# Patient Record
Sex: Male | Born: 1965 | Race: White | Hispanic: No | Marital: Married
Health system: Southern US, Community
[De-identification: ages and names within clinical notes are randomized; demographics above are authoritative.]

---

## 2004-12-06 ENCOUNTER — Encounter: Admission: RE | Admit: 2004-12-06 | Discharge: 2004-12-06 | Payer: Self-pay | Admitting: Internal Medicine

## 2004-12-13 ENCOUNTER — Encounter: Admission: RE | Admit: 2004-12-13 | Discharge: 2004-12-13 | Payer: Self-pay | Admitting: Internal Medicine

## 2004-12-19 ENCOUNTER — Ambulatory Visit (HOSPITAL_COMMUNITY): Admission: RE | Admit: 2004-12-19 | Discharge: 2004-12-20 | Payer: Self-pay | Admitting: Neurosurgery

## 2005-11-08 IMAGING — CR DG CERVICAL SPINE COMPLETE 4+V
6 series · 6 of 6 positions shown · non-contrast
Comparison: None

CLINICAL DATA: Neck pain. History of old cervical injury. No acute injury.

CERVICAL SPINE - 5 VIEW

[view not recorded (1 of 6)]
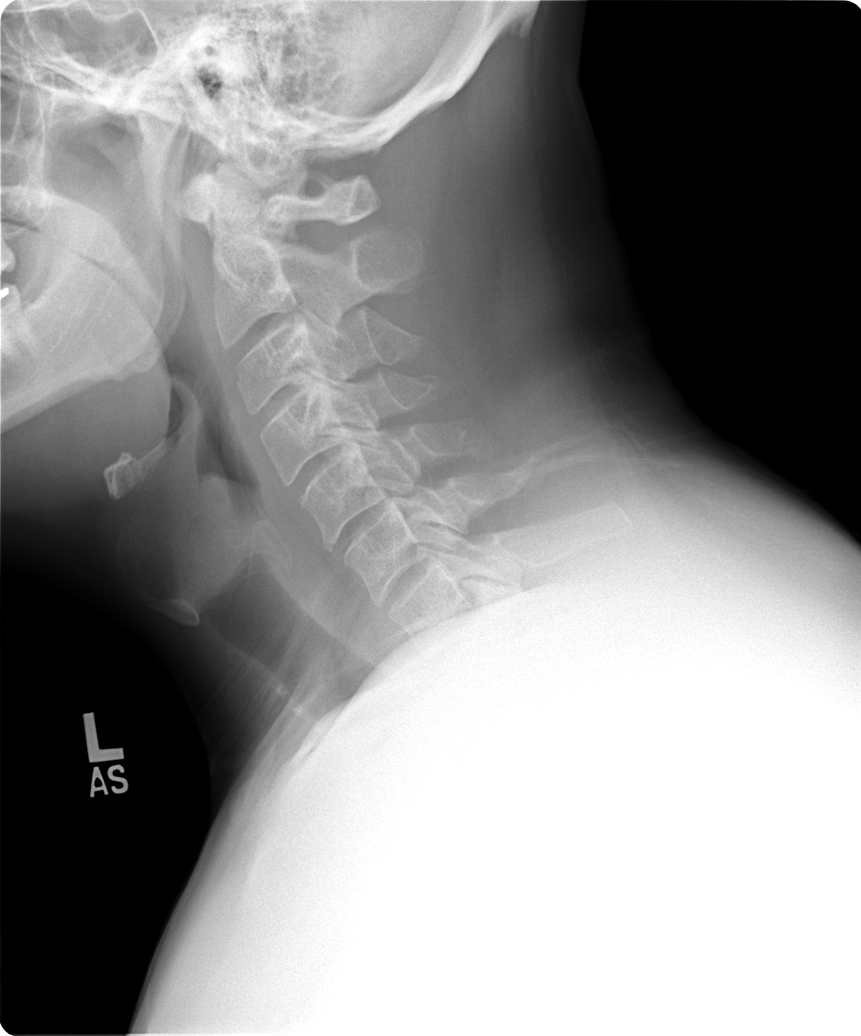

[view not recorded (2 of 6)]
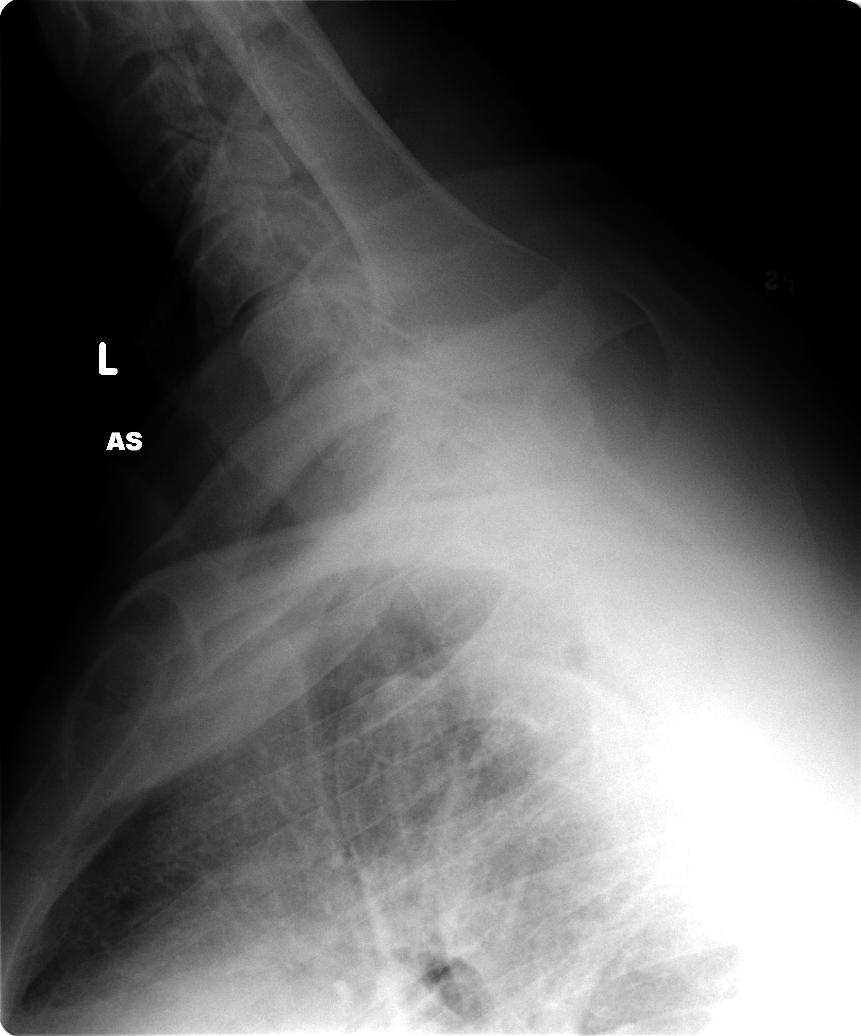

[view not recorded (3 of 6)]
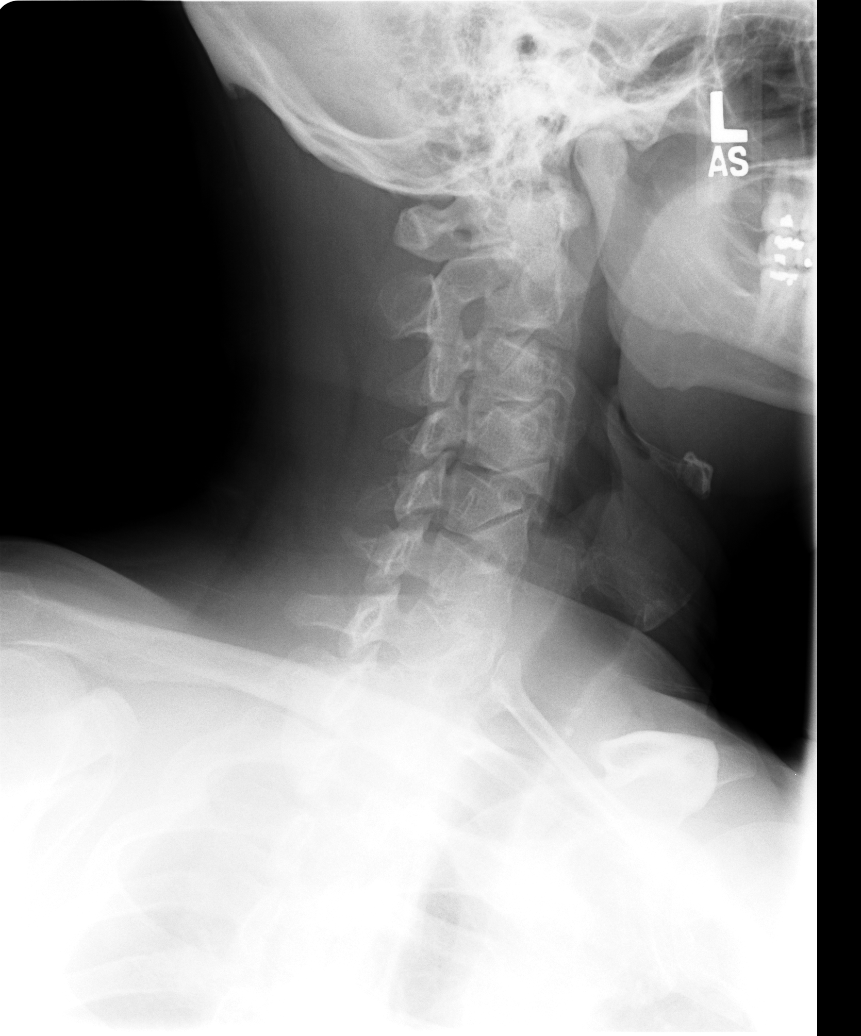

[view not recorded (4 of 6)]
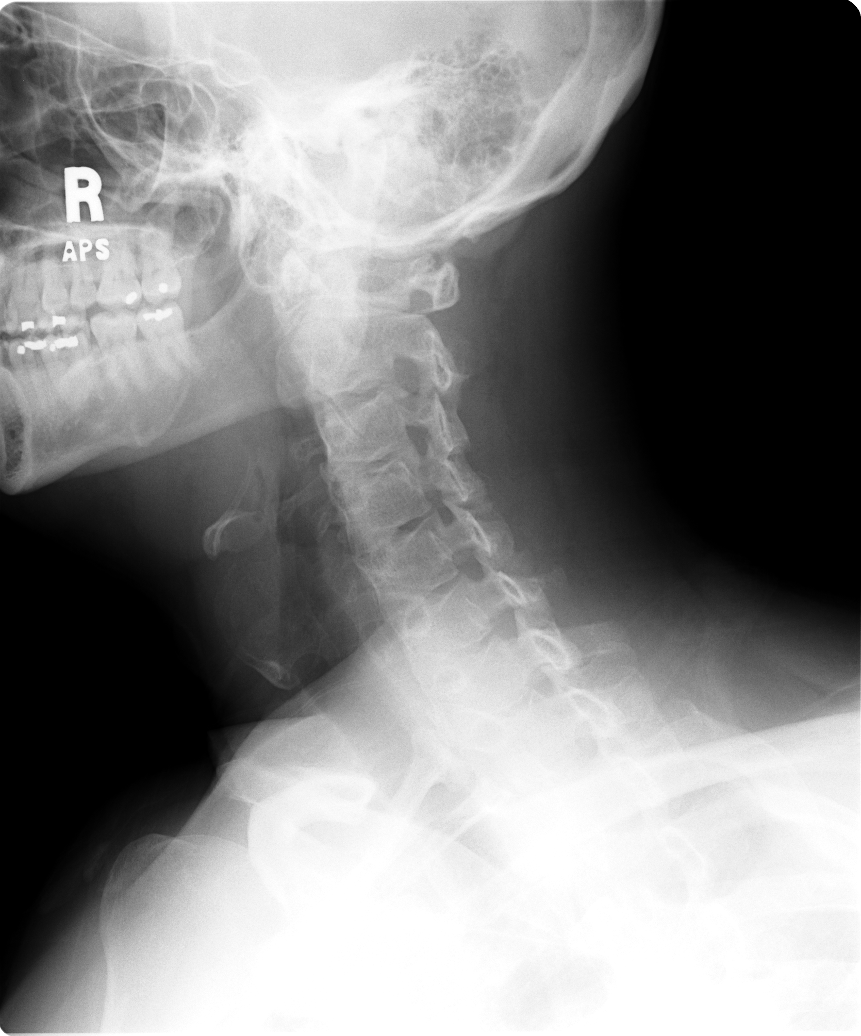

[view not recorded (5 of 6)]
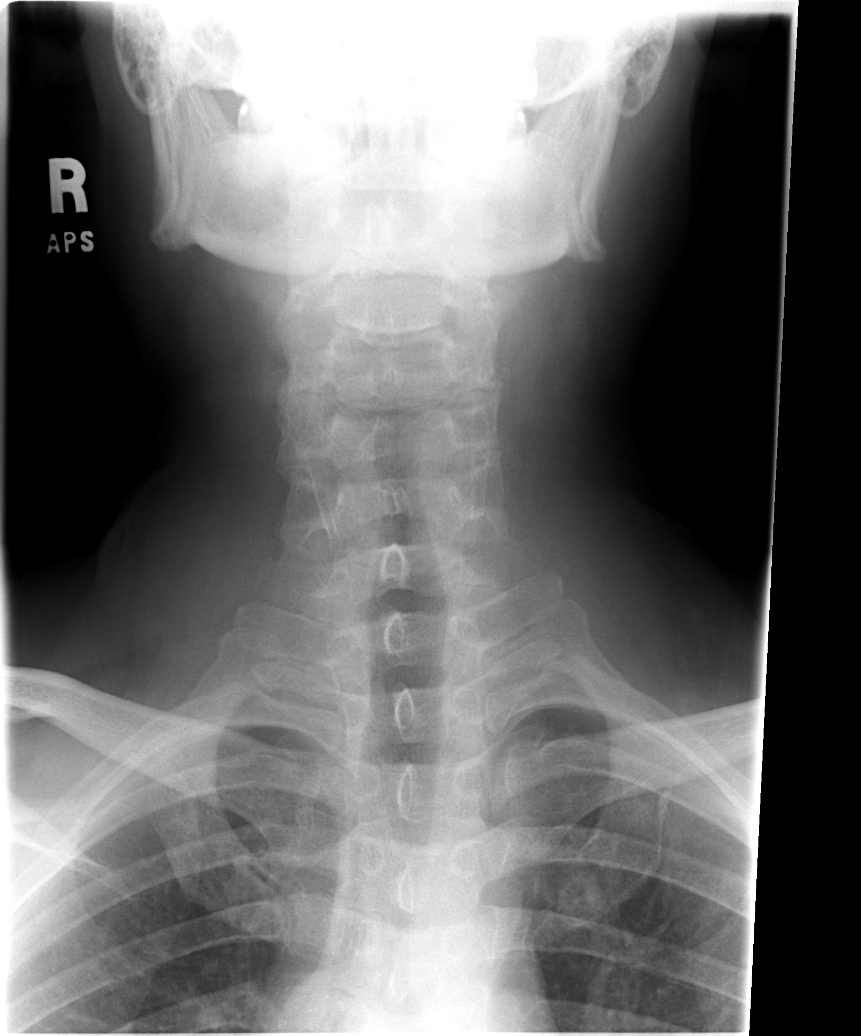

[view not recorded (6 of 6)]
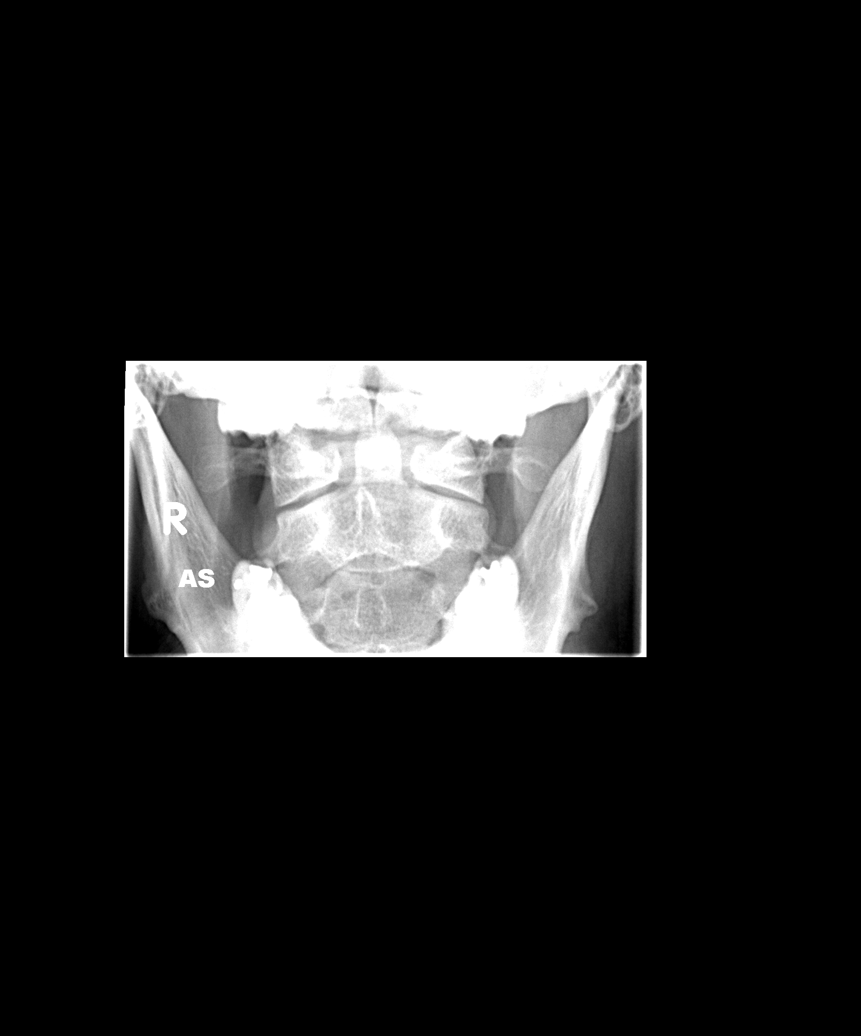

[6 of 6 positions shown; findings below may reference images not displayed]

FINDINGS: Probable old injury involving the C3 vertebral body. No acute bony
abnormality. Specifically no evidence of acute fracture malalignment or
prevertebral soft tissues are normal. Early facet disease noted bilaterally. The
very mild neuroforaminal narrowing particularly on the right at C3-C4, C4-C5,
and C5-C6 and on the left at C4-C5.

IMPRESSION

No acute bony abnormality. Early facet disease. MRI may be helpful to further
evaluation.

## 2022-04-24 ENCOUNTER — Telehealth: Payer: Self-pay

## 2022-04-28 ENCOUNTER — Other Ambulatory Visit: Payer: Self-pay | Admitting: Family Medicine

## 2022-04-28 DIAGNOSIS — S62616S Displaced fracture of proximal phalanx of right little finger, sequela: Secondary | ICD-10-CM

## 2022-04-28 NOTE — Telephone Encounter (Signed)
Ok noted, thanks

## 2022-04-28 NOTE — Telephone Encounter (Signed)
This message was sent to the schedulers

## 2022-05-05 ENCOUNTER — Ambulatory Visit
Admission: RE | Admit: 2022-05-05 | Discharge: 2022-05-05 | Disposition: A | Discharge status: Home / Self Care | Payer: 59 | Source: Ambulatory Visit | Attending: Family Medicine | Admitting: Family Medicine

## 2022-05-05 DIAGNOSIS — S62616S Displaced fracture of proximal phalanx of right little finger, sequela: Secondary | ICD-10-CM

## 2023-02-23 ENCOUNTER — Other Ambulatory Visit: Payer: Self-pay | Admitting: Family Medicine

## 2023-02-23 DIAGNOSIS — I2510 Atherosclerotic heart disease of native coronary artery without angina pectoris: Secondary | ICD-10-CM

## 2023-02-23 NOTE — Progress Notes (Signed)
ASCVD >10.  CAC ordered.

## 2023-04-02 ENCOUNTER — Ambulatory Visit (HOSPITAL_COMMUNITY)
Admission: RE | Admit: 2023-04-02 | Discharge: 2023-04-02 | Disposition: A | Discharge status: Home / Self Care | Payer: Self-pay | Source: Ambulatory Visit | Attending: Family Medicine | Admitting: Family Medicine

## 2023-04-02 DIAGNOSIS — I2510 Atherosclerotic heart disease of native coronary artery without angina pectoris: Secondary | ICD-10-CM | POA: Insufficient documentation

## 2024-05-07 ENCOUNTER — Ambulatory Visit: Admission: EM | Admit: 2024-05-07 | Discharge: 2024-05-07 | Disposition: A | Discharge status: Home / Self Care

## 2024-05-07 ENCOUNTER — Other Ambulatory Visit: Payer: Self-pay

## 2024-05-07 DIAGNOSIS — K611 Rectal abscess: Secondary | ICD-10-CM

## 2024-05-07 MED ORDER — SULFAMETHOXAZOLE-TRIMETHOPRIM 800-160 MG PO TABS: 1.0000 | ORAL_TABLET | Freq: Two times a day (BID) | ORAL | 0 refills | Status: AC

## 2024-05-07 NOTE — ED Provider Notes (Signed)
 [facility]    CSN: [id number] Arrival date & time: 05/07/24  1134      History   Chief Complaint Chief Complaint  Patient presents with   Hemorrhoids    HPI Erik Trevino is a 58 y.o. male.   HPI   Pt presents today with concerns for suspected hemorrhoid He states about a week ago, he noticed a large bump that developed rapidly in the rectal area.  He states that he started applying preparation H for 6-7 days as directed and had planned to make an apt with PCP later next week but then the area started to drain today He reports the drainage looks like pus and denies seeing blood      History reviewed. No pertinent past medical history.  There are no active problems to display for this patient.   History reviewed. No pertinent surgical history.     Home Medications    Prior to Admission medications   Medication Sig Start Date End Date Taking? Authorizing Provider  gabapentin (NEURONTIN) 400 MG capsule Take 400 mg by mouth 3 (three) times daily. 12/04/21  Yes [provider]  sulfamethoxazole -trimethoprim  (BACTRIM  DS) 800-160 MG tablet Take 1 tablet by mouth 2 (two) times daily for 7 days. 05/07/24 05/14/24 Yes Neila Teem, Rocky BRAVO, PA-C    Family History History reviewed. No pertinent family history.  Social History Social History   Tobacco Use   Smoking status: Never   Smokeless tobacco: Never  Vaping Use   Vaping status: Never Used  Substance Use Topics   Alcohol use: Yes   Drug use: Never     Allergies   Patient has no known allergies.   Review of Systems Review of Systems  Gastrointestinal:  Positive for rectal pain.     Physical Exam Triage Vital Signs ED Triage Vitals  Encounter Vitals Group     BP 05/07/24 1311 130/87     Girls Systolic BP Percentile --      Girls Diastolic BP Percentile --      Boys Systolic BP Percentile --      Boys Diastolic BP Percentile --      Pulse Rate 05/07/24 1311 74     Resp 05/07/24 1311  16     Temp 05/07/24 1311 97.8 F (36.6 C)     Temp Source 05/07/24 1311 Oral     SpO2 05/07/24 1311 92 %     Weight 05/07/24 1311 188 lb (85.3 kg)     Height 05/07/24 1311 5' 8 (1.727 m)     Head Circumference --      Peak Flow --      Pain Score 05/07/24 1330 8     Pain Loc --      Pain Education --      Exclude from Growth Chart --    No data found.  Updated Vital Signs BP 130/87 (BP Location: Right Arm)   Pulse 74   Temp 97.8 F (36.6 C) (Oral)   Resp 16   Ht 5' 8 (1.727 m)   Wt 188 lb (85.3 kg)   SpO2 92%   BMI 28.59 kg/m   Visual Acuity Right Eye Distance:   Left Eye Distance:   Bilateral Distance:    Right Eye Near:   Left Eye Near:    Bilateral Near:     Physical Exam Vitals reviewed.  Constitutional:      General: He is awake. He is not in acute distress.  Appearance: Normal appearance. He is well-developed and well-groomed. He is not ill-appearing or toxic-appearing.  HENT:     Head: Normocephalic and atraumatic.  Eyes:     Extraocular Movements: Extraocular movements intact.     Conjunctiva/sclera: Conjunctivae normal.  Pulmonary:     Effort: Pulmonary effort is normal.  Genitourinary:    Rectum: Tenderness and external hemorrhoid present.      Comments: Chaperone declined for rectal exam   Patient has approximately 5 cm diameter abscess with central fluctuance and active drainage.  Peripheral aspects of the abscess appear more indurated.  The area is painful and tender to palpation.  Skin surrounding the rectum as well as perennial tissue appears red and slightly irritated but no signs of further swelling in these areas Musculoskeletal:     Cervical back: Normal range of motion.  Neurological:     Mental Status: He is alert and oriented to person, place, and time.  Psychiatric:        Attention and Perception: Attention normal.        Mood and Affect: Mood normal.        Speech: Speech normal.        Behavior: Behavior normal. Behavior is  cooperative.      UC Treatments / Results  Labs (all labs ordered are listed, but only abnormal results are displayed) Labs Reviewed - No data to display  EKG   Radiology No results found.  Procedures Procedures (including critical care time)  Medications Ordered in UC Medications - No data to display  Initial Impression / Assessment and Plan / UC Course  I have reviewed the triage vital signs and the nursing notes.  Pertinent labs & imaging results that were available during my care of the patient were reviewed by me and considered in my medical decision making (see chart for details).      Final Clinical Impressions(s) / UC Diagnoses   Final diagnoses:  Perirectal abscess   Patient presents today with concerns for swelling in the rectal area that he suspects may be a hemorrhoid.  Physical exam is notable for approximately 5 to 6 cm diameter perirectal abscess that is actively draining.  Reviewed with patient that since the abscess has already opened and is actively draining on its own I do not recommend further incision and drainage at this time.  Recommend warm compresses and/or sitz bath's to encourage further drainage.  Given size of the abscess as well as discomfort will start patient on Bactrim  p.o. twice daily x 7 days to assist with resolution.  I recommend patient follows up with his PCP for ongoing monitoring to ensure adequate resolution.  Reviewed with patient that perirectal abscesses can develop fistulas and this will need to be monitored to ensure complete resolution.  For now recommend alternating Tylenol and ibuprofen as needed for pain management.  ED and return precautions reviewed and provided in after visit summary.  Follow-up as needed    Discharge Instructions      You were seen today for concerns of a mass and pain around your rectal area.  Your exam was notable for an anorectal abscess. Since this area has already started to open up and drain you  can encourage further drainage by using warm compresses to the area or sitz bath's with Epsom salt. I am starting you on a antibiotic called Bactrim  for you to take by mouth twice per day for 7 days.  Please make sure that you finish the entire course  of the antibiotic unless you develop an allergic reaction or if a medical provider tells you to stop Please make sure that you follow-up with your primary care provider for further evaluation and ongoing monitoring to make sure that the abscess is resolving appropriately. If you develop any of the following symptoms please go to the emergency room: Pain with a bowel movement, increased size of the abscess, more severe pain, fever and chills, copious amounts of drainage or bleeding     ED Prescriptions     Medication Sig Dispense Auth. Provider   sulfamethoxazole -trimethoprim  (BACTRIM  DS) 800-160 MG tablet Take 1 tablet by mouth 2 (two) times daily for 7 days. 14 tablet Juell Radney E, PA-C      PDMP not reviewed this encounter.   Marylene Rocky BRAVO, PA-C 05/07/24 1704

## 2024-05-07 NOTE — ED Triage Notes (Signed)
 Pt presents to urgent care with a chief complaint of hemorrhoid x 1 week. Pt states he has been applying preparation H four times a day for 6-7 days. Reports pus/drainage has started to leak out of the hemorrhoid today. Pt currently rates overall pain an 8/10. Standing in triage room due to the pain/discomfort.

## 2024-05-07 NOTE — Discharge Instructions (Addendum)
 You were seen today for concerns of a mass and pain around your rectal area.  Your exam was notable for an anorectal abscess. Since this area has already started to open up and drain you can encourage further drainage by using warm compresses to the area or sitz bath's with Epsom salt. I am starting you on a antibiotic called Bactrim  for you to take by mouth twice per day for 7 days.  Please make sure that you finish the entire course of the antibiotic unless you develop an allergic reaction or if a medical provider tells you to stop Please make sure that you follow-up with your primary care provider for further evaluation and ongoing monitoring to make sure that the abscess is resolving appropriately. If you develop any of the following symptoms please go to the emergency room: Pain with a bowel movement, increased size of the abscess, more severe pain, fever and chills, copious amounts of drainage or bleeding
